# Patient Record
Sex: Male | Born: 1975 | Race: White | Hispanic: No | Marital: Married
Health system: Southern US, Community
[De-identification: ages and names within clinical notes are randomized; demographics above are authoritative.]

---

## 2002-02-19 ENCOUNTER — Encounter: Admission: RE | Admit: 2002-02-19 | Discharge: 2002-02-19 | Payer: Self-pay | Admitting: Family Medicine

## 2003-08-05 ENCOUNTER — Emergency Department (HOSPITAL_COMMUNITY): Admission: EM | Admit: 2003-08-05 | Discharge: 2003-08-05 | Payer: Self-pay | Admitting: Emergency Medicine

## 2005-06-20 IMAGING — CR DG ANKLE COMPLETE 3+V*R*
2 series · 2 of 2 positions shown · non-contrast
Comparison: none

CLINICAL DATA: Twisting injury.  Pain and swelling laterally, right ankle.
 RIGHT ANKLE, THREE VIEWS 
 Soft tissue swelling particularly adjacent to the lateral malleolus.  Two small corticated bony densities projecting between the lateral malleolus and talus are compatible with remote avulsion fracture fragments.  No acute fracture or malalignment. 
 IMPRESSION
 No acute fracture.  See comments above.

[view not recorded (1 of 2)]
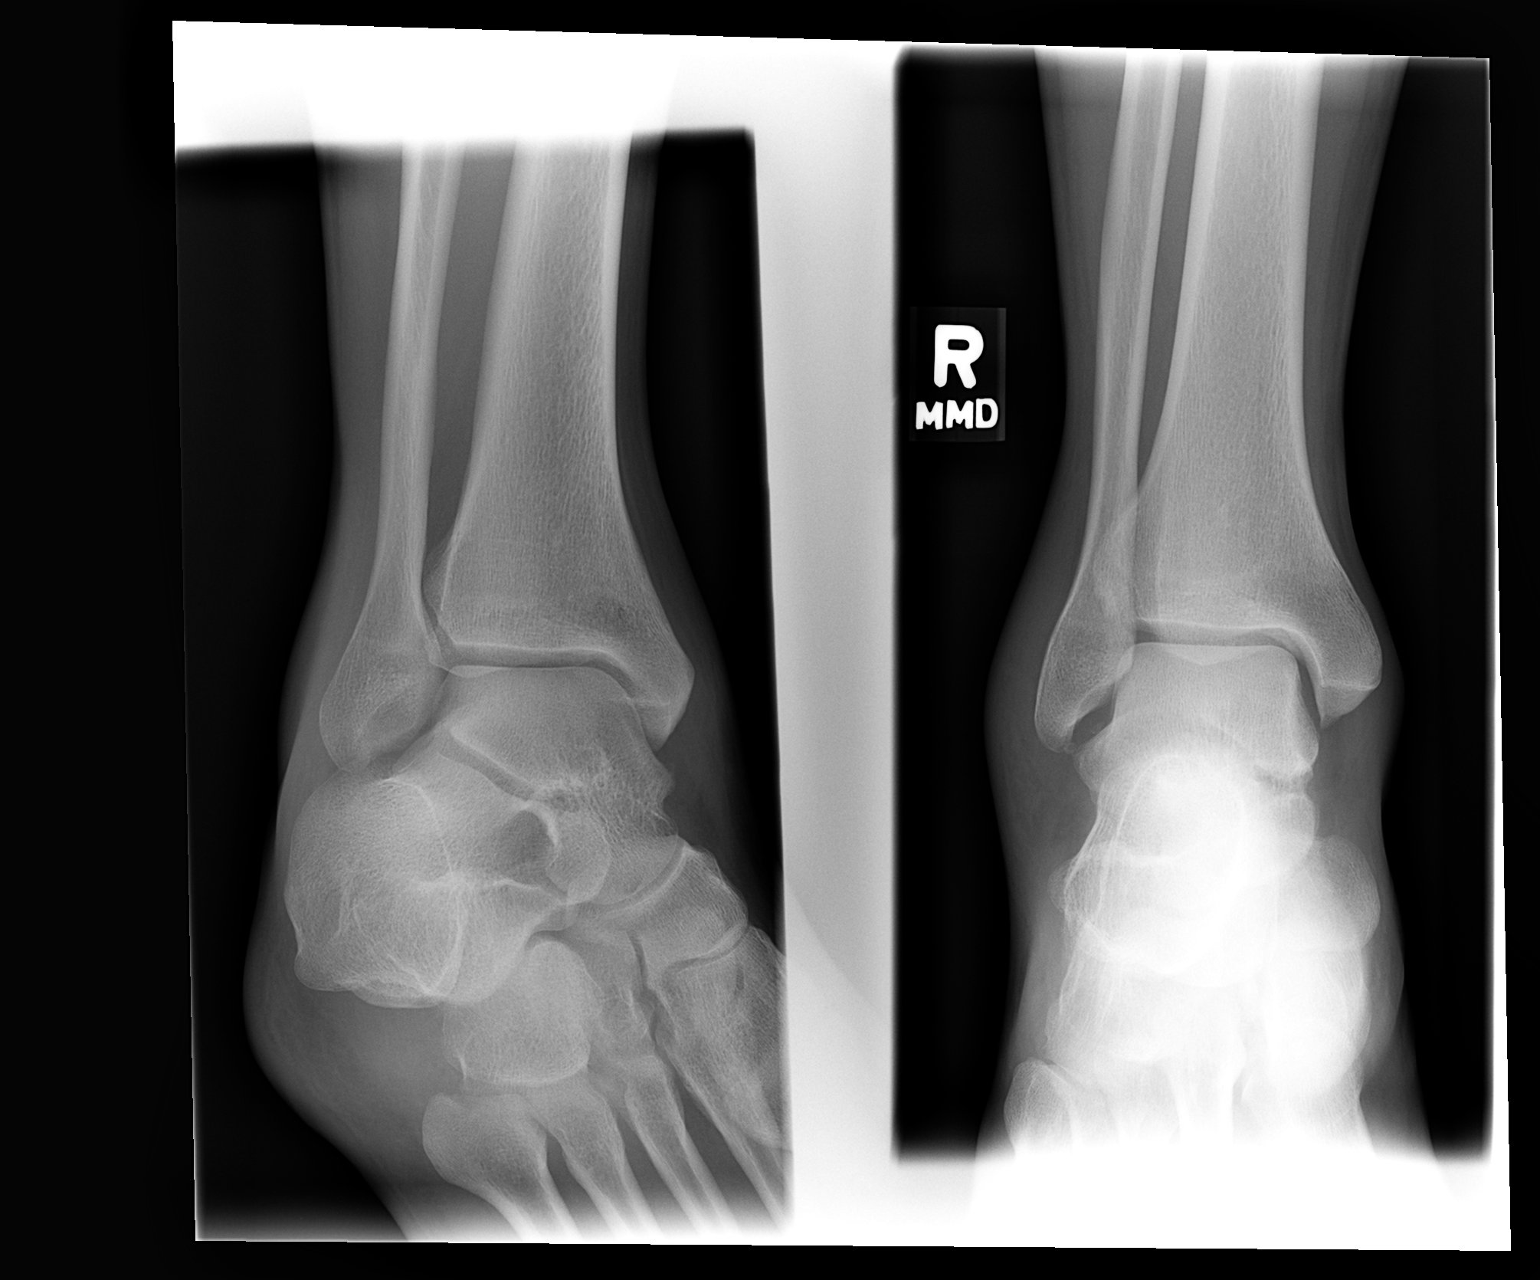

[view not recorded (2 of 2)]
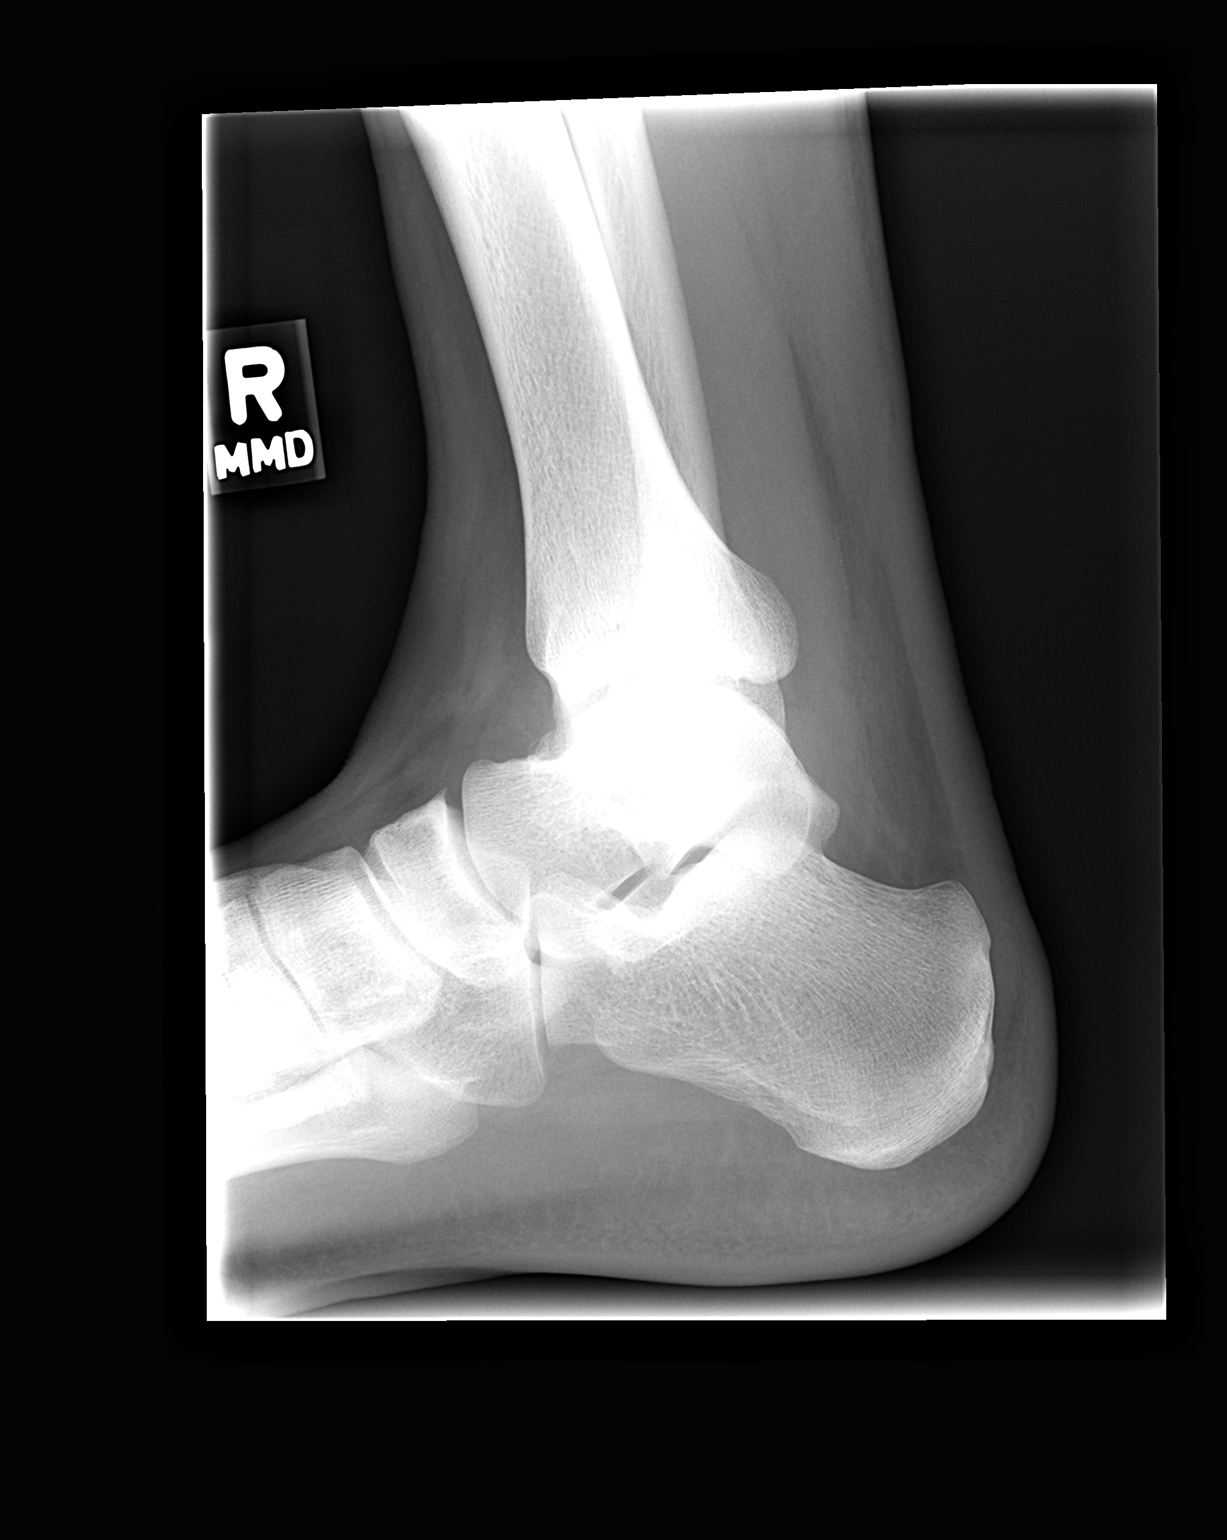

[2 of 2 positions shown; findings below may reference images not displayed]

## 2009-03-10 ENCOUNTER — Ambulatory Visit: Payer: Self-pay | Admitting: Family Medicine

## 2009-03-10 DIAGNOSIS — L210 Seborrhea capitis: Secondary | ICD-10-CM

## 2009-03-20 ENCOUNTER — Ambulatory Visit: Payer: Self-pay | Admitting: Family Medicine

## 2009-03-21 ENCOUNTER — Encounter: Payer: Self-pay | Admitting: Family Medicine

## 2009-03-21 LAB — CONVERTED CEMR LAB
Albumin: 4.6 g/dL (ref 3.5–5.2)
Alkaline Phosphatase: 77 units/L (ref 39–117)
BUN: 13 mg/dL (ref 6–23)
Creatinine, Ser: 0.89 mg/dL (ref 0.40–1.50)
Glucose, Bld: 85 mg/dL (ref 70–99)
HDL: 44 mg/dL (ref >39)
LDL Cholesterol: 57 mg/dL (ref 0–99)
Potassium: 4.2 meq/L (ref 3.5–5.3)
Total Bilirubin: 0.8 mg/dL (ref 0.3–1.2)
Total CHOL/HDL Ratio: 3
Triglycerides: 150 mg/dL — ABNORMAL HIGH (ref <150)

## 2010-08-16 ENCOUNTER — Encounter: Payer: Self-pay | Admitting: *Deleted

## 2013-11-03 ENCOUNTER — Emergency Department (INDEPENDENT_AMBULATORY_CARE_PROVIDER_SITE_OTHER)
Admission: EM | Admit: 2013-11-03 | Discharge: 2013-11-03 | Disposition: A | Discharge status: Home / Self Care | Payer: Self-pay | Source: Home / Self Care | Attending: Emergency Medicine | Admitting: Emergency Medicine

## 2013-11-03 ENCOUNTER — Encounter (HOSPITAL_COMMUNITY): Payer: Self-pay | Admitting: Emergency Medicine

## 2013-11-03 DIAGNOSIS — L237 Allergic contact dermatitis due to plants, except food: Secondary | ICD-10-CM

## 2013-11-03 DIAGNOSIS — L255 Unspecified contact dermatitis due to plants, except food: Secondary | ICD-10-CM

## 2013-11-03 MED ORDER — TRIAMCINOLONE ACETONIDE 0.1 % EX CREA: 1.0000 "application " | TOPICAL_CREAM | Freq: Three times a day (TID) | CUTANEOUS | Status: AC

## 2013-11-03 MED ORDER — PREDNISONE 20 MG PO TABS: ORAL_TABLET | ORAL | Status: AC

## 2013-11-03 MED ORDER — METHYLPREDNISOLONE ACETATE 80 MG/ML IJ SUSP
INTRAMUSCULAR | Status: AC
  Filled 2013-11-03: qty 1

## 2013-11-03 MED ORDER — METHYLPREDNISOLONE ACETATE 80 MG/ML IJ SUSP
80.0000 mg | Freq: Once | INTRAMUSCULAR | Status: AC
  Administered 2013-11-03: 80 mg via INTRAMUSCULAR

## 2013-11-03 NOTE — Discharge Instructions (Signed)
Hiedra venenosa  (Poison Ivy) Luego de la exposicin previa a la planta. La erupcin suele aparecer 48 horas despus de la exposicin. Suelen ser bultos (ppulas) o ampollas (vesculas) en un patrn lineal. abrirse. Los ojos tambin podran hincharse. Las hinchazn es peor por la maana y mejora a medida que avanza el da. Deben tomarse todas las precauciones para prevenir una infeccin bacteriana (por grmenes) secundaria, que puede ocasionar cicatrices. Mantenga todas las reas abiertas secas, limpias y vendadas y cbralas con un ungento antibacteriano, en caso que lo necesite. Si no aparece una infeccin secundaria, esta dermatitis generalmente se cura dentro de las 2 o 3 semanas sin tratamiento. INSTRUCCIONES PARA EL CUIDADO DOMICILIARIO Lvese cuidadosamente con agua y jabn tan pronto como ocurra la exposicin al txico. Tiene alrededor de media hora para retirar la resina de la planta antes de que le cause el sarpullido. El lavado destruir rpidamente el aceite o antgeno que se encuentra sobre la piel y que podr causar el sarpullido. Lave enrgicamente debajo de las uas. Todo resto de resina seguir diseminando el sarpullido. No se frote la piel vigorosamente cuando lava la zona afectada. La dermatitis no se extender si retira todo el aceite de la planta que haya quedado en su cuerpo. Un sarpullido que se ha transformado en lesiones que supuran (llagas) no diseminar el sarpullido, a menos que no se haya lavado cuidadosamente. Tambin es importante lavar todas las prendas que haya utilizado. Pueden tener alrgenos activos. El sarpullido volver, an varios das ms tarde. La mejor medida es evitar el contacto con la planta en el futuro. La hiedra venenosa puede reconocerse por el nmero de hojas, En general, la hiedra venenosa tiene tres hojas con ramas floridas en un tallo simple. Podr adquirir difenhidramina que es un medicamento de venta libre, y utilizarlo segn lo necesite para aliviar la  picazn. No conduzca automviles si este medicamento le produce somnolencia. Consulte con el profesional que lo asiste acerca de los medicamentos que podr administrarle a los nios. SOLICITE ATENCIN MDICA SI:  Observa reas abiertas.  Enrojecimiento que se extiende ms all de la zona del sarpullido.  Una secrecin purulenta (similar al pus).  Aumento del dolor.  Desarrolla otros signos de infeccin (como fiebre). Document Released: 03/24/2005 Document Revised: 09/06/2011 [employer] Patient Information 2014 [employer].  

## 2013-11-03 NOTE — ED Notes (Signed)
Pt  Reports  Rash on  Both  Arms  X  6  Days      Possible  Contact  Dermatitis  From     Being  In the  [place]    He uis  In no  Severe  Distress  No  Angioedema  - he  Is  Sitting  Upright on  exanm table  Appearing in  No  Severe  Distress

## 2013-11-03 NOTE — ED Provider Notes (Signed)
  Chief Complaint    Chief Complaint  Patient presents with  . Rash    History of Present Illness      Paul Alexander is a 38 year old male who has had a one-week history of a rash on both of his forearms. This itches and burns. He denies any rash elsewhere on his body, and these had no difficulty breathing, wheezing, cough, or swelling of the lips, tongue, or throat. He has no lesions on his face or around his eyes. He was doing some yard work the day before the symptoms began and thinks he may have been exposed to poison ivy.  Review of Systems   Other than as noted above, the patient denies any of the following symptoms: Systemic:  No fever or chills. ENT:  No nasal congestion, rhinorrhea, sore throat, swelling of lips, tongue or throat. Resp:  No cough, wheezing, or shortness of breath.  PMFSH    Past medical history, family history, social history, meds, and allergies were reviewed.   Physical Exam     Vital signs:  BP 126/83  Pulse 70  Temp(Src) 98 F (36.7 C) (Oral)  Resp 16  SpO2 100% Gen:  Alert, oriented, in no distress. ENT:  Pharynx clear, no intraoral lesions, moist mucous membranes. Lungs:  Clear to auscultation. Skin:  There were streaks and patches of maculopapules and vesicles on both forearms. Skin was otherwise clear.  [facility]     The following meds were given:  Medications  methylPREDNISolone acetate (DEPO-MEDROL) injection 80 mg (not administered)   Assessment    The encounter diagnosis was Poison ivy.  Plan     1.  Meds:  The following meds were prescribed:   New Prescriptions   PREDNISONE (DELTASONE) 20 MG TABLET    Take 3 daily for 5 days, 2 daily for 5 days, 1 daily for 5 days.   TRIAMCINOLONE CREAM (KENALOG) 0.1 %    Apply 1 application topically 3 (three) times daily.    2.  Patient Education/Counseling:  The patient was given appropriate handouts, self care instructions, and instructed in symptomatic  relief.    3.  Follow up:  The patient was told to follow up here if no better in 3 to 4 days, or sooner if becoming worse in any way, and given some red flag symptoms such as worsening rash, fever, or difficulty breathing which would prompt immediate return.  Follow up here if necessary.      Reuben Likesavid C Azilee Pirro, MD 11/03/13 1415
